# Patient Record
Sex: Male | Born: 1985 | Race: White | Hispanic: No | Marital: Single | Smoking: Never smoker
Health system: Southern US, Community
[De-identification: ages and names within clinical notes are randomized; demographics above are authoritative.]

## PROBLEM LIST (undated history)

## (undated) DIAGNOSIS — J342 Deviated nasal septum: Secondary | ICD-10-CM

## (undated) HISTORY — PX: INGUINAL HERNIA REPAIR: SHX194

---
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
## 2017-02-22 ENCOUNTER — Encounter (HOSPITAL_COMMUNITY): Payer: Self-pay | Admitting: Emergency Medicine

## 2017-02-22 ENCOUNTER — Ambulatory Visit (INDEPENDENT_AMBULATORY_CARE_PROVIDER_SITE_OTHER): Payer: 59

## 2017-02-22 ENCOUNTER — Emergency Department (HOSPITAL_COMMUNITY): Admission: EM | Admit: 2017-02-22 | Discharge: 2017-02-22 | Discharge status: Absent without leave | Payer: Self-pay

## 2017-02-22 ENCOUNTER — Ambulatory Visit (HOSPITAL_COMMUNITY)
Admission: EM | Admit: 2017-02-22 | Discharge: 2017-02-22 | Disposition: A | Discharge status: Home / Self Care | Payer: 59 | Attending: Internal Medicine | Admitting: Internal Medicine

## 2017-02-22 DIAGNOSIS — S62336A Displaced fracture of neck of fifth metacarpal bone, right hand, initial encounter for closed fracture: Secondary | ICD-10-CM
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
## 2017-02-22 NOTE — Discharge Instructions (Signed)
You have a fracture to your fifth metacarpal. You saw Dr Mack Hookavid Thompson today and will receive a call from his office for an appointment. Take Tylenol/Motrin for pain. Keep buddy tape between the fourth and fifth finger.
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
## 2017-07-13 ENCOUNTER — Other Ambulatory Visit: Payer: Self-pay

## 2017-07-13 ENCOUNTER — Emergency Department (HOSPITAL_BASED_OUTPATIENT_CLINIC_OR_DEPARTMENT_OTHER): Payer: 59

## 2017-07-13 ENCOUNTER — Emergency Department (HOSPITAL_BASED_OUTPATIENT_CLINIC_OR_DEPARTMENT_OTHER)
Admission: EM | Admit: 2017-07-13 | Discharge: 2017-07-13 | Disposition: A | Discharge status: Home / Self Care | Payer: 59 | Attending: Emergency Medicine | Admitting: Emergency Medicine

## 2017-07-13 ENCOUNTER — Encounter (HOSPITAL_BASED_OUTPATIENT_CLINIC_OR_DEPARTMENT_OTHER): Payer: Self-pay | Admitting: Emergency Medicine

## 2017-07-13 DIAGNOSIS — S0990XA Unspecified injury of head, initial encounter: Secondary | ICD-10-CM | POA: Diagnosis not present

## 2017-07-13 DIAGNOSIS — S022XXA Fracture of nasal bones, initial encounter for closed fracture: Secondary | ICD-10-CM

## 2017-07-13 DIAGNOSIS — Y999 Unspecified external cause status: Secondary | ICD-10-CM | POA: Diagnosis not present

## 2017-07-13 DIAGNOSIS — Y92481 Parking lot as the place of occurrence of the external cause: Secondary | ICD-10-CM | POA: Insufficient documentation

## 2017-07-13 DIAGNOSIS — S0993XA Unspecified injury of face, initial encounter: Secondary | ICD-10-CM | POA: Diagnosis present

## 2017-07-13 DIAGNOSIS — Y9389 Activity, other specified: Secondary | ICD-10-CM | POA: Diagnosis not present

## 2017-07-13 MED ORDER — IBUPROFEN 600 MG PO TABS: 600.0000 mg | ORAL_TABLET | Freq: Four times a day (QID) | ORAL | 0 refills | Status: DC | PRN

## 2017-07-13 MED ORDER — OXYCODONE-ACETAMINOPHEN 5-325 MG PO TABS: 2.0000 | ORAL_TABLET | ORAL | 0 refills | Status: DC | PRN

## 2017-07-13 MED ORDER — OXYCODONE-ACETAMINOPHEN 5-325 MG PO TABS
1.0000 | ORAL_TABLET | Freq: Once | ORAL | Status: DC
  Filled 2017-07-13 (×2): qty 1

## 2017-07-13 MED ORDER — OXYCODONE-ACETAMINOPHEN 5-325 MG PO TABS
1.0000 | ORAL_TABLET | Freq: Once | ORAL | Status: AC
Start: 2017-07-13 — End: 2017-07-13
  Administered 2017-07-13: 1 via ORAL
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
## 2017-07-13 NOTE — ED Notes (Signed)
Patient reports he would now like percocet, PA made aware.
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
## 2017-07-13 NOTE — Discharge Instructions (Addendum)
Ibuprofen for pain, Percocet for breakthrough pain. Please call tomorrow morning to schedule an appointment for follow-up with ENT. Head imaging was reassuring, as well as your chest x-ray. If you have new or worsening symptoms please return to the emergency department otherwise please follow-up with your primary doctor.

## 2017-07-24 DIAGNOSIS — J342 Deviated nasal septum: Secondary | ICD-10-CM

## 2017-07-24 HISTORY — DX: Deviated nasal septum: J34.2
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
## 2017-07-27 ENCOUNTER — Encounter (HOSPITAL_BASED_OUTPATIENT_CLINIC_OR_DEPARTMENT_OTHER): Payer: Self-pay | Admitting: *Deleted

## 2017-07-27 ENCOUNTER — Other Ambulatory Visit: Payer: Self-pay

## 2017-08-02 ENCOUNTER — Ambulatory Visit (HOSPITAL_BASED_OUTPATIENT_CLINIC_OR_DEPARTMENT_OTHER): Admission: RE | Admit: 2017-08-02 | Payer: 59 | Source: Ambulatory Visit | Admitting: Otolaryngology

## 2017-08-02 HISTORY — DX: Deviated nasal septum: J34.2

## 2017-08-02 SURGERY — SEPTOPLASTY, NOSE
Anesthesia: General
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
## 2022-06-16 DIAGNOSIS — R0981 Nasal congestion: Secondary | ICD-10-CM | POA: Diagnosis not present

## 2022-06-16 DIAGNOSIS — R059 Cough, unspecified: Secondary | ICD-10-CM | POA: Diagnosis not present

## 2022-06-16 DIAGNOSIS — J209 Acute bronchitis, unspecified: Secondary | ICD-10-CM | POA: Diagnosis not present

## 2023-01-08 DIAGNOSIS — H6693 Otitis media, unspecified, bilateral: Secondary | ICD-10-CM | POA: Diagnosis not present

## 2023-01-08 DIAGNOSIS — H6993 Unspecified Eustachian tube disorder, bilateral: Secondary | ICD-10-CM | POA: Diagnosis not present

## 2023-07-18 DIAGNOSIS — J029 Acute pharyngitis, unspecified: Secondary | ICD-10-CM | POA: Diagnosis not present

## 2023-07-18 DIAGNOSIS — H6691 Otitis media, unspecified, right ear: Secondary | ICD-10-CM | POA: Diagnosis not present

## 2023-07-24 ENCOUNTER — Other Ambulatory Visit: Payer: Self-pay

## 2023-07-24 ENCOUNTER — Emergency Department
Admission: EM | Admit: 2023-07-24 | Discharge: 2023-07-24 | Disposition: A | Discharge status: Home / Self Care | Payer: BC Managed Care – PPO | Attending: Emergency Medicine | Admitting: Emergency Medicine

## 2023-07-24 DIAGNOSIS — H6691 Otitis media, unspecified, right ear: Secondary | ICD-10-CM | POA: Insufficient documentation

## 2023-07-24 DIAGNOSIS — H9201 Otalgia, right ear: Secondary | ICD-10-CM | POA: Diagnosis not present

## 2023-07-24 MED ORDER — NEOMYCIN-POLYMYXIN-HC 3.5-10000-1 OT SOLN: 3.0000 [drp] | Freq: Three times a day (TID) | OTIC | 0 refills | Status: DC

## 2023-07-24 MED ORDER — NEOMYCIN-POLYMYXIN-HC 3.5-10000-1 OT SOLN: 3.0000 [drp] | Freq: Three times a day (TID) | OTIC | 0 refills | Status: AC

## 2023-07-24 MED ORDER — CEFDINIR 300 MG PO CAPS: 300.0000 mg | ORAL_CAPSULE | Freq: Two times a day (BID) | ORAL | 0 refills | Status: AC

## 2023-07-24 MED ORDER — CEFDINIR 300 MG PO CAPS: 300.0000 mg | ORAL_CAPSULE | Freq: Two times a day (BID) | ORAL | 0 refills | Status: DC

## 2023-07-24 NOTE — ED Provider Notes (Signed)
[facility] Emergency Department Provider Note     Event Date/Time   First MD Initiated Contact with Patient 07/24/23 1649     (approximate)   History   Facial Swelling   HPI  Joseph Douglas is a 37 y.o. male presents to the ED with complaint of right ear pain X 1 WEEK.  Patient was just evaluated in urgent care when he was diagnosed with otitis media and prescribed amoxicillin.  Patient reports no improvement with medication.  Unchanged ear infection.  Denies fever and chills.     Physical Exam   Triage Vital Signs: ED Triage Vitals  Encounter Vitals Group     BP 07/24/23 1638 (!) 165/111     Systolic BP Percentile --      Diastolic BP Percentile --      Pulse Rate 07/24/23 1638 88     Resp 07/24/23 1638 17     Temp 07/24/23 1638 98.1 F (36.7 C)     Temp Source 07/24/23 1638 Oral     SpO2 07/24/23 1638 99 %     Weight --      Height --      Head Circumference --      Peak Flow --      Pain Score 07/24/23 1641 8     Pain Loc --      Pain Education --      Exclude from Growth Chart --     Most recent vital signs: Vitals:   07/24/23 1638  BP: (!) 165/111  Pulse: 88  Resp: 17  Temp: 98.1 F (36.7 C)  SpO2: 99%    General Awake, no distress.  HEENT NCAT. PERRL. EOMI. No rhinorrhea. Mucous membranes are moist.  CV:  Good peripheral perfusion.  RESP:  Normal effort.  ABD:  No distention.  Other:  Right ear reveals mild erythema of the TM with mild bulging.  EAC is erythemic and edematous.  No discharge.  No perforation.   ED Results / Procedures / Treatments   Labs (all labs ordered are listed, but only abnormal results are displayed) Labs Reviewed - No data to display  No results found.  PROCEDURES:  Critical Care performed: No  Procedures   MEDICATIONS ORDERED IN ED: Medications - No data to display   IMPRESSION / MDM / ASSESSMENT AND PLAN / ED COURSE  I reviewed the triage vital signs and the nursing  notes.                                 37 y.o. male presents to the emergency department for evaluation and treatment of acute ear pain. See HPI for further details.   Differential diagnosis includes, but is not limited to otitis media, otitis sterna, sinusitis  Patient's presentation is most consistent with acute complicated illness / injury requiring diagnostic workup.  Patient is alert and oriented he is hemodynamically stable and afebrile.  Ear exam does reveal a inner ear infection with addition of outer ear infection given the edematous and erythemic external auditory canal.  I have prescribed a antibiotic on top of the amoxicillin that patient is already taking with topical eardrops.  I advised patient if symptoms are to persist following this course of medication that he should follow-up with ENT for further evaluation.  At this time patient is in stable and satisfactory condition for discharge home.  ED precautions discussed.  All questions and concerns were addressed during this ED visit.   FINAL CLINICAL IMPRESSION(S) / ED DIAGNOSES   Final diagnoses:  Otitis media not resolved, right    Rx / DC Orders   ED Discharge Orders          Ordered    cefdinir (OMNICEF) 300 MG capsule  2 times daily,   Status:  Discontinued        07/24/23 1729    neomycin-polymyxin-hydrocortisone (CORTISPORIN) OTIC solution  3 times daily,   Status:  Discontinued        07/24/23 1729    cefdinir (OMNICEF) 300 MG capsule  2 times daily,   Status:  Discontinued        07/24/23 1737    neomycin-polymyxin-hydrocortisone (CORTISPORIN) OTIC solution  3 times daily,   Status:  Discontinued        07/24/23 1737    cefdinir (OMNICEF) 300 MG capsule  2 times daily        07/24/23 1739    neomycin-polymyxin-hydrocortisone (CORTISPORIN) OTIC solution  3 times daily        07/24/23 1739             Note:  This document was prepared using Dragon voice recognition software and may include  unintentional dictation errors.    Romeo Apple, Lashell Moffitt A, PA-C 07/25/23 [facility]    Jene Every, MD 07/26/23 [phone]

## 2023-07-24 NOTE — Discharge Instructions (Addendum)
Your evaluated in the ED for right ear pain that is consistent with otitis media, an ear infection.  Please continue Augmentin until dose is complete with the addition of new antibiotic being prescribed.  You can also pick up Sudafed over-the-counter for decongestion.  Take Tylenol and ibuprofen for pain and discomfort as needed.  Discontinue hydrogen peroxide and alcohol drops inside of the ear canal.  If symptoms do not improve in 1 week please follow-up with Dr. Elenore Rota, ENT, for further evaluation.

## 2023-07-24 NOTE — ED Triage Notes (Signed)
Pt comes with right ear pain for over week. Pt went to [facility] and was given amoxicillin. Pt states he is on day 7 of 10 on this med. Pt states no relief and it isn't getting better.

## 2023-08-02 DIAGNOSIS — H663X3 Other chronic suppurative otitis media, bilateral: Secondary | ICD-10-CM | POA: Diagnosis not present

## 2023-08-02 DIAGNOSIS — H6980 Other specified disorders of Eustachian tube, unspecified ear: Secondary | ICD-10-CM | POA: Diagnosis not present

## 2023-10-23 DEATH — deceased
